# Patient Record
Sex: Female | Born: 1965 | Race: Black or African American | Hispanic: No | Smoking: Never smoker
Health system: Southern US, Community
[De-identification: ages and names within clinical notes are randomized; demographics above are authoritative.]

## PROBLEM LIST (undated history)

## (undated) DIAGNOSIS — M1990 Unspecified osteoarthritis, unspecified site: Secondary | ICD-10-CM

## (undated) HISTORY — PX: KNEE SURGERY: SHX244

---

## 2015-04-19 ENCOUNTER — Encounter (HOSPITAL_COMMUNITY): Payer: Self-pay | Admitting: Emergency Medicine

## 2015-04-19 ENCOUNTER — Emergency Department (HOSPITAL_COMMUNITY): Payer: No Typology Code available for payment source

## 2015-04-19 ENCOUNTER — Emergency Department (HOSPITAL_COMMUNITY)
Admission: EM | Admit: 2015-04-19 | Discharge: 2015-04-19 | Disposition: A | Discharge status: Home / Self Care | Payer: No Typology Code available for payment source | Attending: Emergency Medicine | Admitting: Emergency Medicine

## 2015-04-19 DIAGNOSIS — Q7192 Unspecified reduction defect of left upper limb: Secondary | ICD-10-CM

## 2015-04-19 DIAGNOSIS — S52516A Nondisplaced fracture of unspecified radial styloid process, initial encounter for closed fracture: Secondary | ICD-10-CM | POA: Diagnosis not present

## 2015-04-19 DIAGNOSIS — Y998 Other external cause status: Secondary | ICD-10-CM | POA: Insufficient documentation

## 2015-04-19 DIAGNOSIS — Z8739 Personal history of other diseases of the musculoskeletal system and connective tissue: Secondary | ICD-10-CM | POA: Diagnosis not present

## 2015-04-19 DIAGNOSIS — Y92410 Unspecified street and highway as the place of occurrence of the external cause: Secondary | ICD-10-CM | POA: Insufficient documentation

## 2015-04-19 DIAGNOSIS — Y9389 Activity, other specified: Secondary | ICD-10-CM | POA: Insufficient documentation

## 2015-04-19 DIAGNOSIS — S6992XA Unspecified injury of left wrist, hand and finger(s), initial encounter: Secondary | ICD-10-CM | POA: Diagnosis present

## 2015-04-19 DIAGNOSIS — S62102A Fracture of unspecified carpal bone, left wrist, initial encounter for closed fracture: Secondary | ICD-10-CM

## 2015-04-19 HISTORY — DX: Unspecified osteoarthritis, unspecified site: M19.90

## 2015-04-19 MED ORDER — ONDANSETRON HCL 4 MG/2ML IJ SOLN
4.0000 mg | Freq: Once | INTRAMUSCULAR | Status: AC
  Administered 2015-04-19: 4 mg via INTRAVENOUS
  Filled 2015-04-19: qty 2

## 2015-04-19 MED ORDER — OXYCODONE-ACETAMINOPHEN 5-325 MG PO TABS
1.0000 | ORAL_TABLET | Freq: Once | ORAL | Status: AC
  Administered 2015-04-19: 1 via ORAL
  Filled 2015-04-19: qty 1

## 2015-04-19 MED ORDER — OXYCODONE-ACETAMINOPHEN 5-325 MG PO TABS: 1.0000 | ORAL_TABLET | ORAL | Status: AC | PRN

## 2015-04-19 MED ORDER — HYDROMORPHONE HCL 1 MG/ML IJ SOLN
1.0000 mg | Freq: Once | INTRAMUSCULAR | Status: AC
  Administered 2015-04-19: 1 mg via INTRAVENOUS
  Filled 2015-04-19: qty 1

## 2015-04-19 MED ORDER — LIDOCAINE HCL 2 % IJ SOLN
20.0000 mL | Freq: Once | INTRAMUSCULAR | Status: DC
  Filled 2015-04-19: qty 20
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
## 2015-04-19 NOTE — Progress Notes (Signed)
Orthopedic Tech Progress Note Patient Details:  Traci DerbyRosita Allen 11/25/1965 [id number]  Ortho Devices Type of Ortho Device: Ace wrap, Sugartong splint, Arm sling Ortho Device/Splint Location: LUE Ortho Device/Splint Interventions: Ordered, Application   Jennye MoccasinHughes, [name] [name] 04/19/2015, 7:44 PM

## 2015-04-19 NOTE — Discharge Instructions (Signed)
Cast or Splint Care °Casts and splints support injured limbs and keep bones from moving while they heal. It is important to care for your cast or splint at home.   °HOME CARE INSTRUCTIONS °· Keep the cast or splint uncovered during the drying period. It can take 24 to 48 hours to dry if it is made of plaster. A fiberglass cast will dry in less than 1 hour. °· Do not rest the cast on anything harder than a pillow for the first 24 hours. °· Do not put weight on your injured limb or apply pressure to the cast until your health care provider gives you permission. °· Keep the cast or splint dry. Wet casts or splints can lose their shape and may not support the limb as well. A wet cast that has lost its shape can also create harmful pressure on your skin when it dries. Also, wet skin can become infected. °¨ Cover the cast or splint with a plastic bag when bathing or when out in the rain or snow. If the cast is on the trunk of the body, take sponge baths until the cast is removed. °¨ If your cast does become wet, dry it with a towel or a blow dryer on the cool setting only. °· Keep your cast or splint clean. Soiled casts may be wiped with a moistened cloth. °· Do not place any hard or soft foreign objects under your cast or splint, such as cotton, toilet paper, lotion, or powder. °· Do not try to scratch the skin under the cast with any object. The object could get stuck inside the cast. Also, scratching could lead to an infection. If itching is a problem, use a blow dryer on a cool setting to relieve discomfort. °· Do not trim or cut your cast or remove padding from inside of it. °· Exercise all joints next to the injury that are not immobilized by the cast or splint. For example, if you have a long leg cast, exercise the hip joint and toes. If you have an arm cast or splint, exercise the shoulder, elbow, thumb, and fingers. °· Elevate your injured arm or leg on 1 or 2 pillows for the first 1 to 3 days to decrease  swelling and pain. It is best if you can comfortably elevate your cast so it is higher than your heart. °SEEK MEDICAL CARE IF:  °· Your cast or splint cracks. °· Your cast or splint is too tight or too loose. °· You have unbearable itching inside the cast. °· Your cast becomes wet or develops a soft spot or area. °· You have a bad smell coming from inside your cast. °· You get an object stuck under your cast. °· Your skin around the cast becomes red or raw. °· You have new pain or worsening pain after the cast has been applied. °SEEK IMMEDIATE MEDICAL CARE IF:  °· You have fluid leaking through the cast. °· You are unable to move your fingers or toes. °· You have discolored (blue or white), cool, painful, or very swollen fingers or toes beyond the cast. °· You have tingling or numbness around the injured area. °· You have severe pain or pressure under the cast. °· You have any difficulty with your breathing or have shortness of breath. °· You have chest pain. °Document Released: 09/23/2000 Document Revised: 07/17/2013 Document Reviewed: 04/04/2013 °ExitCare® Patient Information ©2015 [employer]. This information is not intended to replace advice given to you by your health care   provider. Make sure you discuss any questions you have with your health care provider.  Acute Compartment Syndrome Compartment syndrome is a painful condition that occurs when swelling and pressure build up in a body space (compartment) of the arms or legs. Groups of muscles, nerves, and blood vessels in the arms and legs are separated into various compartments. Each compartment is surrounded by tough layers of tissue called fascia. In compartment syndrome, pressure builds up within the layers of fascia and begins to push on the structures within that compartment.  In acute compartment syndrome, the pressure builds up suddenly, often as the result of an injury. This is a surgical emergency. When a muscle in the compartment moves, you  may feel severe pain. If pressure continues to increase, it can block the flow of blood in the smallest blood vessels (capillaries). Then, the nerves and muscles in the compartment cannot get enough oxygen and nutrients (substances needed for survival). They will start to die within 4-8 hours. That is why the pressure needs to be relieved immediately. Identifying the condition early and treating it quickly can prevent most problems. CAUSES  Various things can lead to compartment syndrome. Possible causes include:   Injury. Some injuries can cause swelling or bleeding in a compartment. This can lead to compartment syndrome. Injuries that may cause this problem include:  Broken bones, especially the long bones of the arms and legs.  Crushing injuries.  Penetrating injuries, such as a knife wound that punctures the skin and tissue underneath.  Badly bruised muscles.  Poisonous bites, such as a snake bite.  Severe burns.  Blocked blood flow. This could result from:  A cast or bandage that is too tight.  A surgical procedure. Blood flow sometimes has to be stopped for a while during a surgery, usually with a tourniquet.  Lying for too long in a position that restricts blood flow. This can happen in people who have nerve damage or if a person is unconscious for a long time.  Drugs used to build up muscles (anabolic steroids).  Drugs that keep the blood from forming clots (blood thinners). SIGNS AND SYMPTOMS  The most common symptom of compartment syndrome is pain. The pain may:   Get worse when moving or stretching the affected body part.  Be more severe than it should be for an injury.  Come along with a feeling of tingling or burning.  Become worse when the area is pushed or squeezed.  Be unaffected by pain medicine. Other symptoms include:   A feeling of tightness or fullness in the affected area.   A loss of feeling.  Weakness in the area.  Loss of movement.  Skin  becoming pale, tight, and shiny over the painful area.  DIAGNOSIS  Your health care provider may suspect the problem based on how you describe the pain. The diagnosis is made by using a special device that measures the pressure in the affected area. Blood tests, X-rays, or an ultrasound exam may be done to help rule out other problems.  TREATMENT  Compartment syndrome is a surgical emergency. It should be treated very quickly.   First-aid treatment is given first. This may include:  Promptly treating an injury.  Loosening or removing any cast, bandage, or external wrap that may be causing pain.  Raising the painful arm or leg to the same level as the heart.  Giving oxygen.  Giving fluid through an IV access tube that is put into a vein in the  hand or arm.  Surgery (fasciotomy) is needed to relieve the pressure and help prevent permanent damage. In this surgery, cuts (incisions) are made through the fascia to relieve the pressure in the compartment. Document Released: 09/14/2009 Document Revised: 05/29/2013 Document Reviewed: 04/30/2013 [facility] Patient Information 2015 [place], [place]. This information is not intended to replace advice given to you by your health care provider. Make sure you discuss any questions you have with your health care provider.

## 2015-04-19 NOTE — ED Notes (Signed)
Patient with steady gait.  Discharge instructions and prescription reviewed, voiced understanding.
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
## 2015-04-19 NOTE — ED Notes (Signed)
Patient returned from X-ray 

## 2015-04-19 NOTE — ED Notes (Signed)
Lidocaine, Suture Cart, and Ortho Tech at the bedside.

## 2015-04-19 NOTE — ED Notes (Signed)
Patient assisted times 2 to the BR.   Patient steady on her feet.  Back to bed and scrub pants applied  Family here at bedside

## 2015-04-19 NOTE — ED Notes (Signed)
Ortho tech and Dr. Janee Mornhompson at bedside.

## 2015-04-19 NOTE — ED Notes (Signed)
Call radiology for CD of all xray.  Stated it would be about 15 minutes.

## 2015-04-19 NOTE — ED Notes (Signed)
Patient transported to X-ray 
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
